# Patient Record
Sex: Female | Born: 1990 | Race: Black or African American | Hispanic: No | Marital: Single | Smoking: Former smoker
Health system: Southern US, Community
[De-identification: ages and names within clinical notes are randomized; demographics above are authoritative.]

---

## 2013-08-05 ENCOUNTER — Emergency Department (HOSPITAL_COMMUNITY)
Admission: EM | Admit: 2013-08-05 | Discharge: 2013-08-06 | Disposition: A | Discharge status: Home / Self Care | Payer: Medicaid Other | Attending: Emergency Medicine | Admitting: Emergency Medicine

## 2013-08-05 ENCOUNTER — Encounter (HOSPITAL_COMMUNITY): Payer: Self-pay | Admitting: Emergency Medicine

## 2013-08-05 DIAGNOSIS — B9689 Other specified bacterial agents as the cause of diseases classified elsewhere: Secondary | ICD-10-CM | POA: Insufficient documentation

## 2013-08-05 DIAGNOSIS — Z3202 Encounter for pregnancy test, result negative: Secondary | ICD-10-CM | POA: Insufficient documentation

## 2013-08-05 DIAGNOSIS — Z87891 Personal history of nicotine dependence: Secondary | ICD-10-CM | POA: Insufficient documentation

## 2013-08-05 DIAGNOSIS — Z880 Allergy status to penicillin: Secondary | ICD-10-CM | POA: Insufficient documentation

## 2013-08-05 DIAGNOSIS — A499 Bacterial infection, unspecified: Secondary | ICD-10-CM | POA: Insufficient documentation

## 2013-08-05 DIAGNOSIS — B3731 Acute candidiasis of vulva and vagina: Secondary | ICD-10-CM | POA: Insufficient documentation

## 2013-08-05 DIAGNOSIS — N390 Urinary tract infection, site not specified: Secondary | ICD-10-CM

## 2013-08-05 DIAGNOSIS — B373 Candidiasis of vulva and vagina: Secondary | ICD-10-CM

## 2013-08-05 DIAGNOSIS — N760 Acute vaginitis: Secondary | ICD-10-CM | POA: Insufficient documentation

## 2013-08-05 LAB — POCT I-STAT, CHEM 8
BUN: 10 mg/dL (ref 6–23)
Chloride: 100 mEq/L (ref 96–112)
Potassium: 3.6 mEq/L (ref 3.5–5.1)
Sodium: 141 mEq/L (ref 135–145)

## 2013-08-05 NOTE — ED Notes (Signed)
Pt. Stated, I have all types of issues with my birth control, I've been bleeding for a month and i keep having bacteria infections.  i have a Merena inserted and they will not take it out.

## 2013-08-05 NOTE — ED Notes (Signed)
Pelvic cart set up outside of room. 

## 2013-08-06 LAB — URINE MICROSCOPIC-ADD ON

## 2013-08-06 LAB — WET PREP, GENITAL: Trich, Wet Prep: NONE SEEN

## 2013-08-06 LAB — URINALYSIS, ROUTINE W REFLEX MICROSCOPIC
Glucose, UA: NEGATIVE mg/dL
Hgb urine dipstick: NEGATIVE
Specific Gravity, Urine: 1.028 (ref 1.005–1.030)

## 2013-08-06 LAB — PREGNANCY, URINE: Preg Test, Ur: NEGATIVE

## 2013-08-06 MED ORDER — FLUCONAZOLE 200 MG PO TABS: 200.0000 mg | ORAL_TABLET | Freq: Every day | ORAL | Status: AC

## 2013-08-06 MED ORDER — CEPHALEXIN 250 MG PO CAPS
500.0000 mg | ORAL_CAPSULE | Freq: Once | ORAL | Status: AC
  Administered 2013-08-06: 500 mg via ORAL
  Filled 2013-08-06: qty 2

## 2013-08-06 MED ORDER — METRONIDAZOLE 500 MG PO TABS: 500.0000 mg | ORAL_TABLET | Freq: Two times a day (BID) | ORAL | Status: AC

## 2013-08-06 MED ORDER — CEPHALEXIN 500 MG PO CAPS: 500.0000 mg | ORAL_CAPSULE | Freq: Four times a day (QID) | ORAL | Status: AC

## 2013-08-06 NOTE — ED Notes (Signed)
Been having abnormal bleeding for over a year, but in the past month it has increased, no pain, no Nausea or vomiting

## 2013-08-06 NOTE — ED Notes (Signed)
Patient is resting comfortably. 

## 2013-08-06 NOTE — ED Notes (Signed)
Family at bedside. 

## 2013-08-06 NOTE — ED Notes (Signed)
Patient denies pain and is resting comfortably.  

## 2013-08-06 NOTE — ED Provider Notes (Signed)
CSN: [id number]     Arrival date & time 08/05/13  2223 History   First MD Initiated Contact with Patient 08/05/13 2311     Chief Complaint  Patient presents with  . Vaginal Bleeding   (Consider location/radiation/quality/duration/timing/severity/associated sxs/prior Treatment) The history is provided by the patient.   patient reports vaginal discharge over the past 2 weeks and developed a urinary symptoms described as dysuria and urinary frequency over the past 2-3 days.  No fevers or chills.  No nausea or vomiting.  She denies abdominal pain.  She states she's also been having irregular menstrual cycles over the past year and followup with her OB/GYN but needs to follow back up and she has been unable to make the appointments.  She has not had an ultrasound.  She denies back pain or flank pain.  Friend with her reports no altered mental status.  She denies lightheadedness.  No nausea vomiting.  History reviewed. No pertinent past medical history. History reviewed. No pertinent past surgical history. No family history on file. History  Substance Use Topics  . Smoking status: Former Games developer  . Smokeless tobacco: Not on file  . Alcohol Use: Yes   OB History   Grav Para Term Preterm Abortions TAB SAB Ect Mult Living                 Review of Systems  All other systems reviewed and are negative.    Allergies  Penicillins  Home Medications   Current Outpatient Rx  Name  Route  Sig  Dispense  Refill  . metroNIDAZOLE (METROGEL) 0.75 % vaginal gel   Vaginal   Place 1 Applicatorful vaginally daily. STARTED ON 08-02-13 FOR 5 DAYS         . cephALEXin (KEFLEX) 500 MG capsule   Oral   Take 1 capsule (500 mg total) by mouth 4 (four) times daily.   28 capsule   0   . fluconazole (DIFLUCAN) 200 MG tablet   Oral   Take 1 tablet (200 mg total) by mouth daily.   4 tablet   0   . metroNIDAZOLE (FLAGYL) 500 MG tablet   Oral   Take 1 tablet (500 mg total) by mouth 2 (two) times  daily.   14 tablet   0    BP 114/59  Pulse 83  Temp(Src) 98 F (36.7 C) (Oral)  Resp 16  Ht 4' 11.5" (1.511 m)  Wt 134 lb (60.782 kg)  BMI 26.62 kg/m2  SpO2 100% Physical Exam  Nursing note and vitals reviewed. Constitutional: She is oriented to person, place, and time. She appears well-developed and well-nourished. No distress.  HENT:  Head: Normocephalic and atraumatic.  Eyes: EOM are normal.  Neck: Normal range of motion.  Cardiovascular: Normal rate, regular rhythm and normal heart sounds.   Pulmonary/Chest: Effort normal and breath sounds normal.  Abdominal: Soft. She exhibits no distension. There is no tenderness.  Genitourinary:  Normal external genitalia.  Patient with vaginal discharge was creamy in appearance.  No obvious foul smell.  No cervical motion tenderness.  No adnexal masses or fullness.  Musculoskeletal: Normal range of motion.  Neurological: She is alert and oriented to person, place, and time.  Skin: Skin is warm and dry.  Psychiatric: She has a normal mood and affect. Judgment normal.    ED Course  Procedures (including critical care time) Labs Review Labs Reviewed  WET PREP, GENITAL - Abnormal; Notable for the following:    Yeast Wet Prep  HPF POC FEW (*)    Clue Cells Wet Prep HPF POC FEW (*)    WBC, Wet Prep HPF POC MODERATE (*)    All other components within normal limits  URINALYSIS, ROUTINE W REFLEX MICROSCOPIC - Abnormal; Notable for the following:    APPearance CLOUDY (*)    Protein, ur 100 (*)    Leukocytes, UA SMALL (*)    All other components within normal limits  URINE MICROSCOPIC-ADD ON - Abnormal; Notable for the following:    Bacteria, UA FEW (*)    All other components within normal limits  URINE CULTURE  GC/CHLAMYDIA PROBE AMP  PREGNANCY, URINE  POCT I-STAT, CHEM 8    I reviewed available ER/hospitalization records through the EMR  Imaging Review No results found.  MDM   1. Urinary tract infection   2. Bacterial  vaginosis   3. Vaginal candidiasis    Patient is overall well-appearing.  Patient be treated for urinary tract infection, bacterial vaginosis,  vaginal candidiasis.  Discharge home in good condition.  OB/GYN followup.    Lyanne Co, MD 08/06/13 [phone]

## 2013-08-07 LAB — GC/CHLAMYDIA PROBE AMP: CT Probe RNA: POSITIVE — AB

## 2013-08-08 ENCOUNTER — Telehealth (HOSPITAL_COMMUNITY): Payer: Self-pay | Admitting: Emergency Medicine

## 2013-08-08 LAB — URINE CULTURE

## 2013-08-08 NOTE — ED Notes (Signed)
Patient has +Chalmydia and +Gonorrhea.

## 2013-08-08 NOTE — ED Notes (Signed)
+  Chlamydia. +Gonorrhea. Chart sent to EDP office. [facility] attached.

## 2013-08-09 ENCOUNTER — Telehealth (HOSPITAL_COMMUNITY): Payer: Self-pay | Admitting: Emergency Medicine

## 2013-08-09 NOTE — ED Notes (Signed)
Post ED Visit - Positive Culture Follow-up  Culture report reviewed by antimicrobial stewardship pharmacist: []  Wes Dulaney, Pharm.D., BCPS [x]  Celedonio Miyamoto, Pharm.D., BCPS []  Georgina Pillion, Pharm.D., BCPS []  [place], [address].D., BCPS, AAHIVP []  Estella Husk, Pharm.D., BCPS, AAHIVP  Positive urine culture Treated with Keflex, organism sensitive to the same and no further patient follow-up is required at this time.  Kylie A Holland 08/09/2013, 9:50 AM

## 2013-08-13 NOTE — ED Notes (Addendum)
Chart returned from EDP office .With Rx written by Coral Ceo for Azithromycin 1 gram po x 1 and cefixime 400 mg 1 po x 1  Called to  [phone].By Sheralyn Boatman PFM

## 2013-08-25 ENCOUNTER — Encounter: Payer: Medicaid Other | Admitting: Obstetrics

## 2013-09-28 ENCOUNTER — Ambulatory Visit: Payer: Medicaid Other | Admitting: Obstetrics

## 2014-01-30 ENCOUNTER — Encounter (HOSPITAL_COMMUNITY): Payer: Self-pay | Admitting: Emergency Medicine

## 2014-01-30 ENCOUNTER — Emergency Department (HOSPITAL_COMMUNITY)
Admission: EM | Admit: 2014-01-30 | Discharge: 2014-01-30 | Disposition: A | Discharge status: Home / Self Care | Payer: Medicaid Other | Attending: Emergency Medicine | Admitting: Emergency Medicine

## 2014-01-30 ENCOUNTER — Emergency Department (HOSPITAL_COMMUNITY): Payer: Medicaid Other

## 2014-01-30 DIAGNOSIS — M79609 Pain in unspecified limb: Secondary | ICD-10-CM | POA: Insufficient documentation

## 2014-01-30 DIAGNOSIS — M79645 Pain in left finger(s): Secondary | ICD-10-CM

## 2014-01-30 DIAGNOSIS — Z79899 Other long term (current) drug therapy: Secondary | ICD-10-CM | POA: Insufficient documentation

## 2014-01-30 DIAGNOSIS — Z87891 Personal history of nicotine dependence: Secondary | ICD-10-CM | POA: Insufficient documentation

## 2014-01-30 DIAGNOSIS — Z792 Long term (current) use of antibiotics: Secondary | ICD-10-CM | POA: Insufficient documentation

## 2014-01-30 DIAGNOSIS — G8929 Other chronic pain: Secondary | ICD-10-CM

## 2014-01-30 DIAGNOSIS — Z880 Allergy status to penicillin: Secondary | ICD-10-CM | POA: Insufficient documentation

## 2014-01-30 MED ORDER — NAPROXEN 500 MG PO TABS: 500.0000 mg | ORAL_TABLET | Freq: Two times a day (BID) | ORAL | Status: AC

## 2014-01-30 NOTE — ED Notes (Signed)
Pt needed a note for work; work note given for return back on 01/31/14

## 2014-01-30 NOTE — ED Provider Notes (Signed)
CSN: [id number]     Arrival date & time 01/30/14  2020 History  This chart was scribed for non-physician practitioner, Junius Finner, PA-C working with Toy Baker, MD by Greggory Stallion, ED scribe. This patient was seen in room TR05C/TR05C and the patient's care was started at 9:01 PM.   Chief Complaint  Patient presents with  . Hand Pain   The history is provided by the patient. No language interpreter was used.   HPI Comments: Lori Farrell is a 23 y.o. female who presents to the Emergency Department complaining of intermittent left thumb pain that started 4 month ago. Reports waking up one morning with it stiff, aching mild pain, and decreased ROM since then.  Denies injury. She states she can't bend her thumb back like she normally can. Trying to bend it back is what makes the pain start. Pt is able to give a "thumbs up" sign without pain but when trying to bend back more, like her right thumb, she experiences pain. Pt has not taken any medications for her pain. Denies wrist pain.   History reviewed. No pertinent past medical history. History reviewed. No pertinent past surgical history. No family history on file. History  Substance Use Topics  . Smoking status: Former Games developer  . Smokeless tobacco: Not on file  . Alcohol Use: Yes   OB History   Grav Para Term Preterm Abortions TAB SAB Ect Mult Living                 Review of Systems  Musculoskeletal: Positive for arthralgias.  All other systems reviewed and are negative.   Allergies  Penicillins  Home Medications   Current Outpatient Rx  Name  Route  Sig  Dispense  Refill  . cephALEXin (KEFLEX) 500 MG capsule   Oral   Take 1 capsule (500 mg total) by mouth 4 (four) times daily.   28 capsule   0   . metroNIDAZOLE (FLAGYL) 500 MG tablet   Oral   Take 1 tablet (500 mg total) by mouth 2 (two) times daily.   14 tablet   0   . metroNIDAZOLE (METROGEL) 0.75 % vaginal gel   Vaginal   Place 1 Applicatorful vaginally  daily. STARTED ON 08-02-13 FOR 5 DAYS         . naproxen (NAPROSYN) 500 MG tablet   Oral   Take 1 tablet (500 mg total) by mouth 2 (two) times daily.   30 tablet   0    BP 115/67  Pulse 96  Temp(Src) 98.8 F (37.1 C) (Oral)  Resp 18  Ht 4\' 11"  (1.499 m)  Wt 134 lb 9 oz (61.037 kg)  BMI 27.16 kg/m2  SpO2 97%  LMP 01/27/2014  Physical Exam  Nursing note and vitals reviewed. Constitutional: She is oriented to person, place, and time. She appears well-developed and well-nourished.  HENT:  Head: Normocephalic and atraumatic.  Eyes: EOM are normal.  Neck: Normal range of motion.  Cardiovascular: Normal rate.   Pulmonary/Chest: Effort normal.  Musculoskeletal: Normal range of motion.  Mild tenderness with hyperextension of left thumb. Mild crepitus. No edema, erythema, ecchymosis. Capillary refill less than 2 seconds. Sensation to light touch intact.   Neurological: She is alert and oriented to person, place, and time.  Skin: Skin is warm and dry.  Psychiatric: She has a normal mood and affect. Her behavior is normal.    ED Course  Procedures (including critical care time)  DIAGNOSTIC STUDIES: Oxygen Saturation is  97% on RA, normal by my interpretation.    COORDINATION OF CARE: 9:02 PM-Discussed treatment plan which includes xray with pt at bedside and pt agreed to plan.   Labs Review Labs Reviewed - No data to display Imaging Review Dg Finger Thumb Left  01/30/2014   CLINICAL DATA:  First metacarpophalangeal joint pain.  No injury.  EXAM: LEFT THUMB 2+V  COMPARISON:  None.  FINDINGS: There is no evidence of fracture or dislocation. There is no evidence of arthropathy or other focal bone abnormality. Soft tissues are unremarkable  IMPRESSION: Negative.   Electronically Signed   By: Amie Portlandavid  Ormond M.D.   On: 01/30/2014 20:58     EKG Interpretation None      MDM   Final diagnoses:  Chronic pain of left thumb    Pt c/o left thumb pain x2877mo w/o hx of injury. No  evidence of underlying infection.  Neurovascularly in tact.  Plain films: no evidence of arthropathy or other focal bone abnormality.  Will tx with NSAIDs. Advised to f/u with PCP and Dr. Izora Ribasoley, hand surgery if symptoms not improving. Return precautions provided. Pt verbalized understanding and agreement with tx plan.   I personally performed the services described in this documentation, which was scribed in my presence. The recorded information has been reviewed and is accurate.   Junius Finnerrin O'Malley, PA-C 01/30/14 2228

## 2014-01-30 NOTE — ED Notes (Signed)
Hand pain x 4 months.  sts mainly its her left thumb that will not bend back.

## 2014-01-31 NOTE — ED Provider Notes (Signed)
Medical screening examination/treatment/procedure(s) were performed by non-physician practitioner and as supervising physician I was immediately available for consultation/collaboration.  Mariaha Ellington T Yanixan Mellinger, MD 01/31/14 1604 

## 2015-02-16 ENCOUNTER — Emergency Department (HOSPITAL_COMMUNITY)
Admission: EM | Admit: 2015-02-16 | Discharge: 2015-02-16 | Disposition: A | Discharge status: Home / Self Care | Payer: BLUE CROSS/BLUE SHIELD | Source: Home / Self Care | Attending: Family Medicine | Admitting: Family Medicine

## 2015-02-16 ENCOUNTER — Encounter (HOSPITAL_COMMUNITY): Payer: Self-pay | Admitting: *Deleted

## 2015-02-16 DIAGNOSIS — M940 Chondrocostal junction syndrome [Tietze]: Secondary | ICD-10-CM | POA: Diagnosis not present

## 2015-02-16 MED ORDER — DICLOFENAC SODIUM 75 MG PO TBEC: 75.0000 mg | DELAYED_RELEASE_TABLET | Freq: Two times a day (BID) | ORAL | Status: AC

## 2015-02-16 NOTE — ED Notes (Signed)
Pt    Reports     Chest  Pain     Worse  When  She      Coughs  Sneezes  And  Or  Moves          -    She  Reports      She has  Had  The  Problems  In past  But  Worse  Over  Last  sev  Days           She  Is  Awake  And  Alert  And  Oriented  She  Is  Speaking in  Complete  sentances

## 2015-02-16 NOTE — Discharge Instructions (Signed)
Chest Wall Pain  Chest wall pain is pain felt in or around the chest bones and muscles. It may take up to 6 weeks to get better. It may take longer if you are active. Chest wall pain can happen on its own. Other times, things like germs, injury, coughing, or exercise can cause the pain.  HOME CARE   · Avoid activities that make you tired or cause pain. Try not to use your chest, belly (abdominal), or side muscles. Do not use heavy weights.  · Put ice on the sore area.  ¨ Put ice in a plastic bag.  ¨ Place a towel between your skin and the bag.  ¨ Leave the ice on for 15-20 minutes for the first 2 days.  · Only take medicine as told by your doctor.  GET HELP RIGHT AWAY IF:   · You have more pain or are very uncomfortable.  · You have a fever.  · Your chest pain gets worse.  · You have new problems.  · You feel sick to your stomach (nauseous) or throw up (vomit).  · You start to sweat or feel lightheaded.  · You have a cough with mucus (phlegm).  · You cough up blood.  MAKE SURE YOU:   · Understand these instructions.  · Will watch your condition.  · Will get help right away if you are not doing well or get worse.  Document Released: 04/30/2008 Document Revised: 02/04/2012 Document Reviewed: 07/09/2011  ExitCare® Patient Information ©2015 [employer]. This information is not intended to replace advice given to you by your health care provider. Make sure you discuss any questions you have with your health care provider.  Costochondritis  Costochondritis is a condition in which the tissue (cartilage) that connects your ribs with your breastbone (sternum) becomes irritated. It causes pain in the chest and rib area. It usually goes away on its own over time.  HOME CARE  · Avoid activities that wear you out.  · Do not strain your ribs. Avoid activities that use your:  ¨ Chest.  ¨ Belly.  ¨ Side muscles.  · Put ice on the area for the first 2 days after the pain starts.  ¨ Put ice in a plastic bag.  ¨ Place a towel  between your skin and the bag.  ¨ Leave the ice on for 20 minutes, 2-3 times a day.  · Only take medicine as told by your doctor.  GET HELP IF:  · You have redness or puffiness (swelling) in the rib area.  · Your pain does not go away with rest or medicine.  GET HELP RIGHT AWAY IF:   · Your pain gets worse.  · You are very uncomfortable.  · You have trouble breathing.  · You cough up blood.  · You start sweating or throwing up (vomiting).  · You have a fever or lasting symptoms for more than 2-3 days.  · You have a fever and your symptoms suddenly get worse.  MAKE SURE YOU:   · Understand these instructions.  · Will watch your condition.  · Will get help right away if you are not doing well or get worse.  Document Released: 04/30/2008 Document Revised: 07/15/2013 Document Reviewed: 06/16/2013  ExitCare® Patient Information ©2015 [employer]. This information is not intended to replace advice given to you by your health care provider. Make sure you discuss any questions you have with your health care provider.

## 2015-02-16 NOTE — ED Provider Notes (Signed)
CSN: [id number]     Arrival date & time 02/16/15  1717 History   First MD Initiated Contact with Patient 02/16/15 1819     Chief Complaint  Patient presents with  . Chest Pain   (Consider location/radiation/quality/duration/timing/severity/associated sxs/prior Treatment) HPI Comments: Patient presents left sided chest pain x 2 weeks. No known injury or pre-illness. Pain is when yawning, coughing or sneezing. Wakes her in the night when turning over in the bed. No dyspnea. No fever or chills. No radicular pain or weakness. No palpitations.   Patient is a 24 y.o. female presenting with chest pain. The history is provided by the patient.  Chest Pain Associated symptoms: no cough, no fatigue and no fever     History reviewed. No pertinent past medical history. History reviewed. No pertinent past surgical history. History reviewed. No pertinent family history. History  Substance Use Topics  . Smoking status: Former Games developermoker  . Smokeless tobacco: Not on file  . Alcohol Use: Yes   OB History    No data available     Review of Systems  Constitutional: Negative for fever and fatigue.  Respiratory: Negative for apnea, cough and wheezing.   Cardiovascular: Positive for chest pain.  Skin: Negative.   Allergic/Immunologic: Negative.     Allergies  Penicillins  Home Medications   Prior to Admission medications   Medication Sig Start Date End Date Taking? Authorizing Provider  cephALEXin (KEFLEX) 500 MG capsule Take 1 capsule (500 mg total) by mouth 4 (four) times daily. 08/06/13   Azalia BilisKevin Campos, MD  diclofenac (VOLTAREN) 75 MG EC tablet Take 1 tablet (75 mg total) by mouth 2 (two) times daily. 02/16/15   Riki SheerMichelle G Young, PA-C  metroNIDAZOLE (FLAGYL) 500 MG tablet Take 1 tablet (500 mg total) by mouth 2 (two) times daily. 08/06/13   Azalia BilisKevin Campos, MD  metroNIDAZOLE (METROGEL) 0.75 % vaginal gel Place 1 Applicatorful vaginally daily. STARTED ON 08-02-13 FOR 5 DAYS    Historical Provider, MD   naproxen (NAPROSYN) 500 MG tablet Take 1 tablet (500 mg total) by mouth 2 (two) times daily. 01/30/14   Junius FinnerErin O'Malley, PA-C   BP 117/74 mmHg  Pulse 70  Temp(Src) 98.4 F (36.9 C) (Oral)  Resp 20  SpO2 100%  LMP 02/06/2015 Physical Exam  Constitutional: She is oriented to person, place, and time. She appears well-developed and well-nourished.  Pulmonary/Chest: Effort normal and breath sounds normal. No respiratory distress. She has no wheezes. She exhibits tenderness.  Reproducible Pain left chest wall with palpation or ROM of the left shoulder  Musculoskeletal: She exhibits tenderness. She exhibits no edema.  Neurological: She is alert and oriented to person, place, and time.  Skin: Skin is warm and dry. No rash noted. No erythema.  Psychiatric: Her behavior is normal.  Nursing note and vitals reviewed.   ED Course  Procedures (including critical care time) Labs Review Labs Reviewed - No data to display  Imaging Review No results found.   MDM   1. Costochondritis    Treat with NSAID x 2 weeks-no indication this a cardiac event.  IF worsens return for further evaluation.     Riki SheerMichelle G Young, PA-C 02/16/15 [id number]

## 2015-05-17 IMAGING — CR DG FINGER THUMB 2+V*L*
3 series · 3 of 3 positions shown · non-contrast
Comparison: None.

CLINICAL DATA: First metacarpophalangeal joint pain.  No injury.

EXAM:
LEFT THUMB 2+V

[x finger pa left]
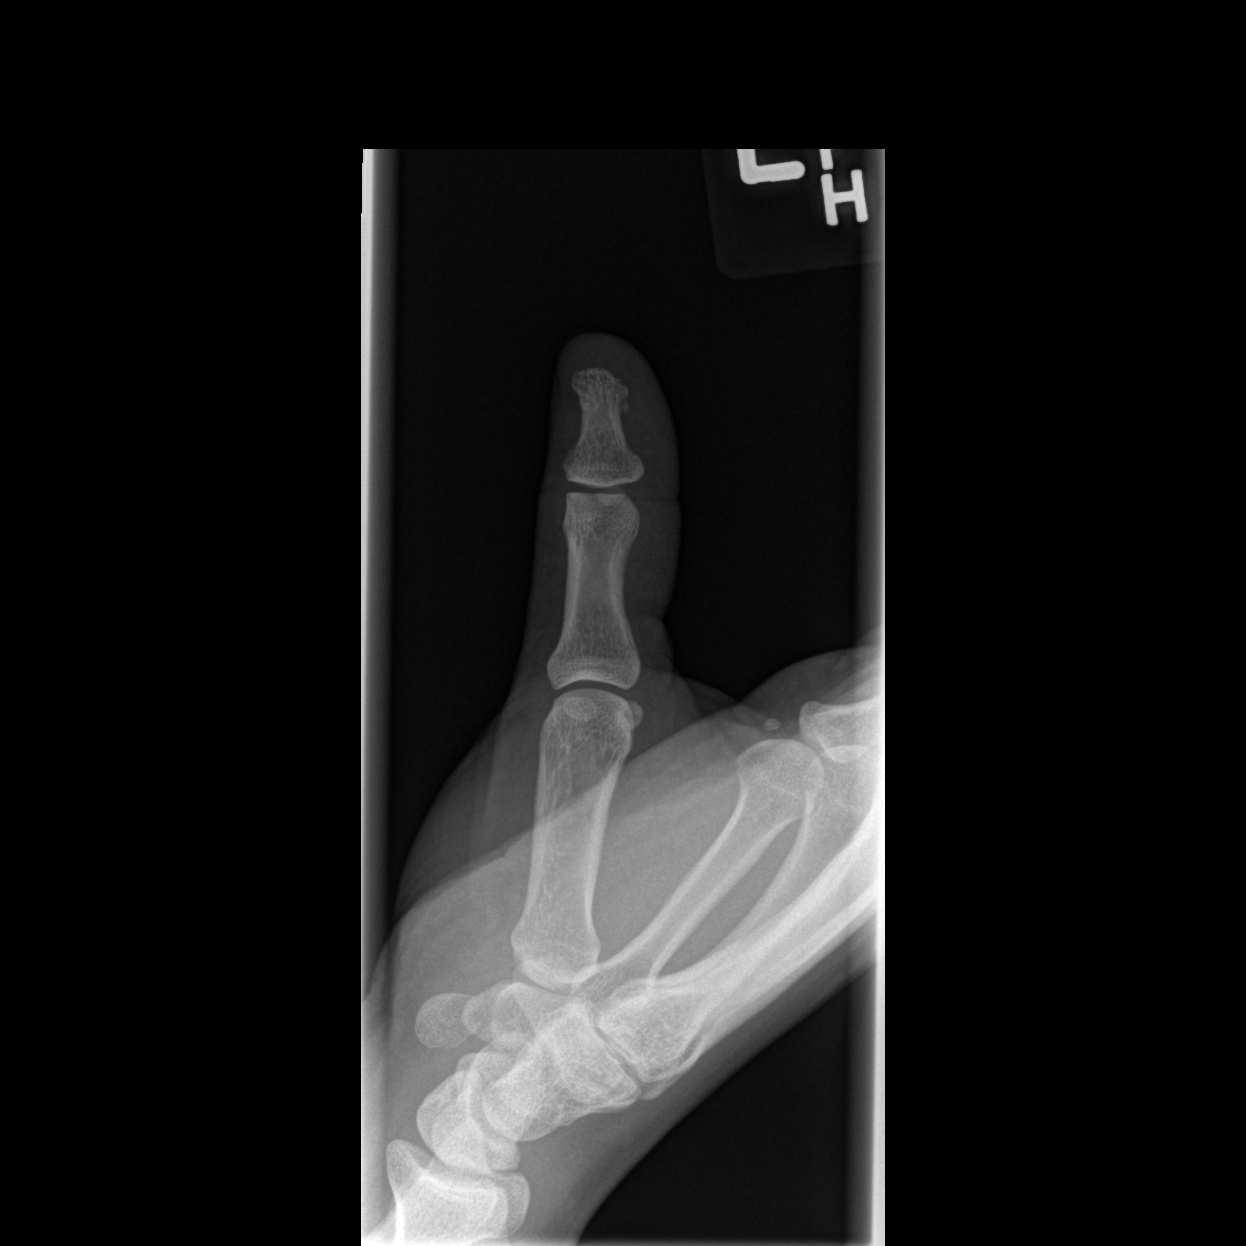

[x finger obl. left]
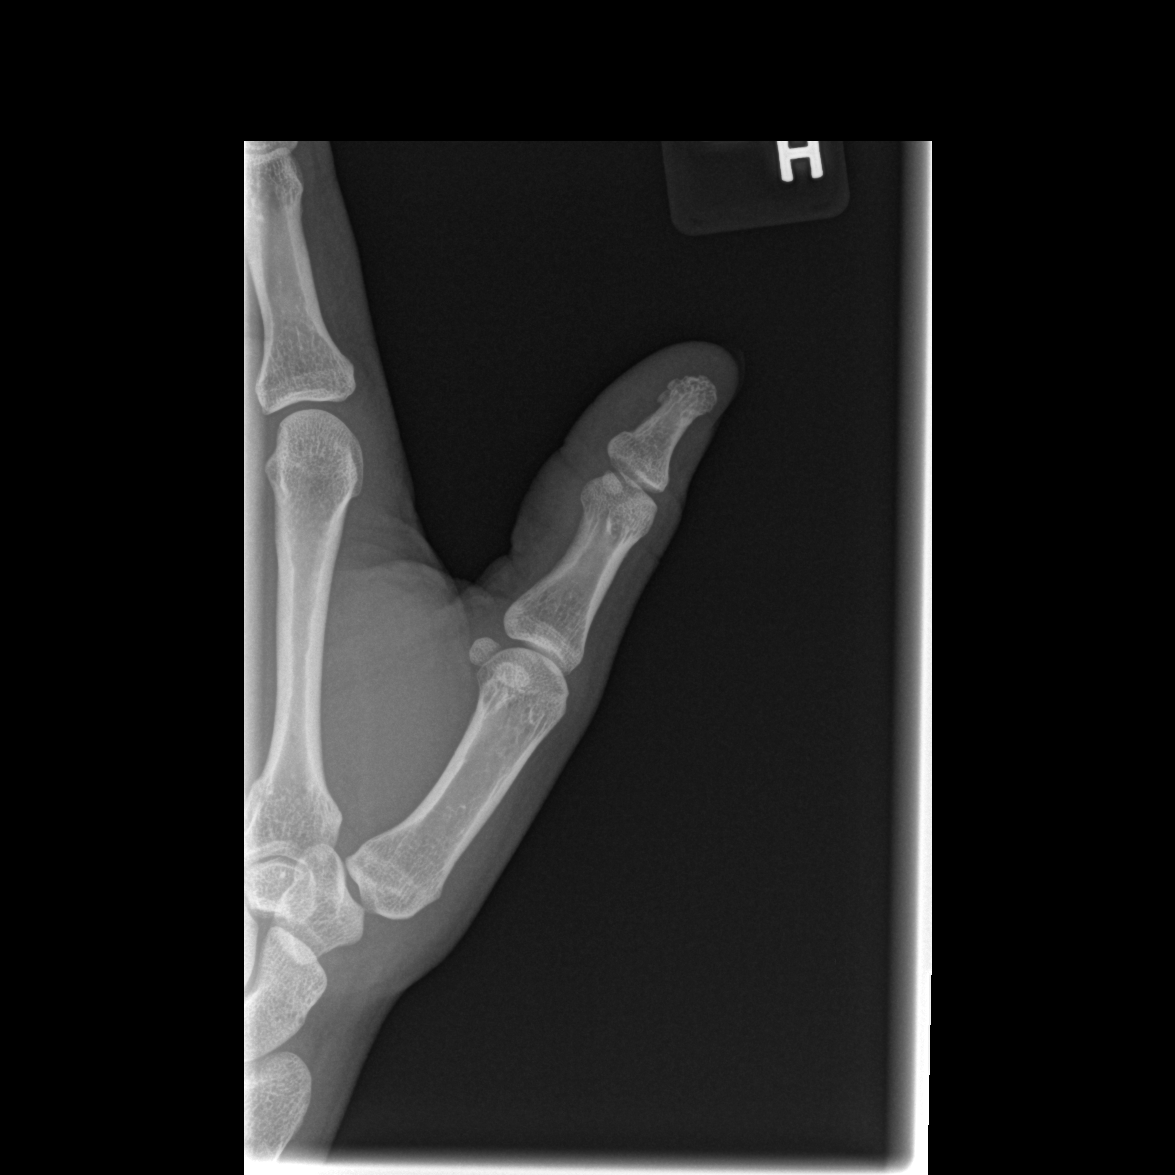

[x finger lateral left]
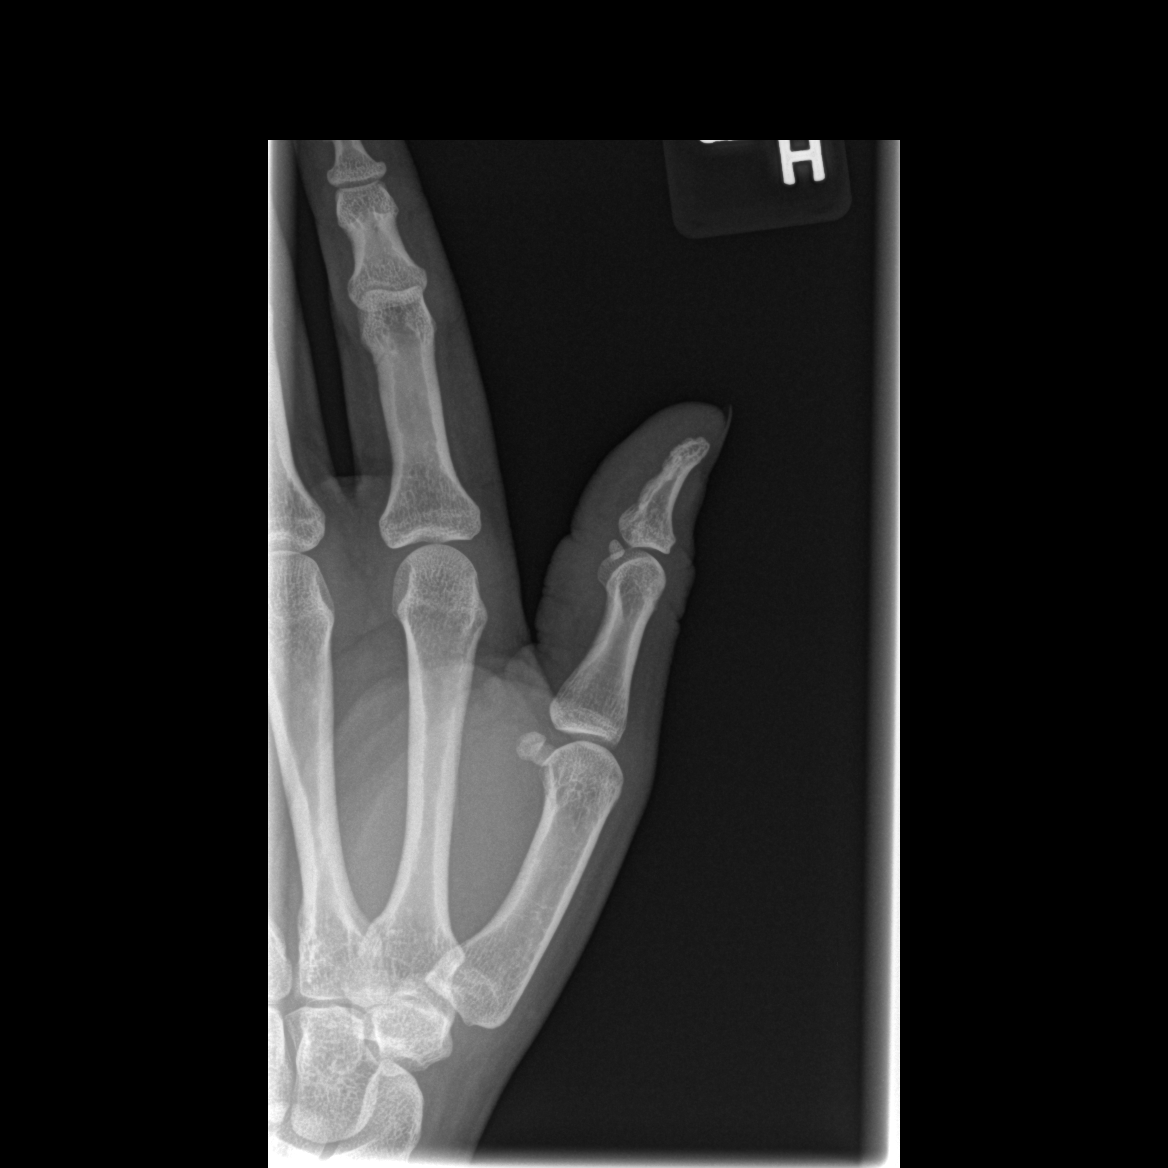

[3 of 3 positions shown; findings below may reference images not displayed]

FINDINGS: There is no evidence of fracture or dislocation. There is no
evidence of arthropathy or other focal bone abnormality. Soft
tissues are unremarkable
IMPRESSION: Negative.
